# Patient Record
Sex: Male | Born: 2005 | Race: White | Hispanic: No | Marital: Single | Smoking: Never smoker
Health system: Southern US, Community
[De-identification: ages and names within clinical notes are randomized; demographics above are authoritative.]

## PROBLEM LIST (undated history)

## (undated) DIAGNOSIS — T148XXA Other injury of unspecified body region, initial encounter: Secondary | ICD-10-CM

## (undated) DIAGNOSIS — J329 Chronic sinusitis, unspecified: Secondary | ICD-10-CM

## (undated) DIAGNOSIS — R109 Unspecified abdominal pain: Secondary | ICD-10-CM

## (undated) DIAGNOSIS — R059 Cough, unspecified: Secondary | ICD-10-CM

## (undated) DIAGNOSIS — F419 Anxiety disorder, unspecified: Secondary | ICD-10-CM

## (undated) DIAGNOSIS — G8929 Other chronic pain: Secondary | ICD-10-CM

## (undated) DIAGNOSIS — J45909 Unspecified asthma, uncomplicated: Secondary | ICD-10-CM

## (undated) DIAGNOSIS — G4700 Insomnia, unspecified: Secondary | ICD-10-CM

## (undated) DIAGNOSIS — F32A Depression, unspecified: Secondary | ICD-10-CM

## (undated) DIAGNOSIS — M9390 Osteochondropathy, unspecified of unspecified site: Secondary | ICD-10-CM

## (undated) DIAGNOSIS — H9190 Unspecified hearing loss, unspecified ear: Secondary | ICD-10-CM

## (undated) DIAGNOSIS — R519 Headache, unspecified: Secondary | ICD-10-CM

## (undated) DIAGNOSIS — H53001 Unspecified amblyopia, right eye: Secondary | ICD-10-CM

## (undated) DIAGNOSIS — D649 Anemia, unspecified: Secondary | ICD-10-CM

## (undated) HISTORY — DX: Unspecified amblyopia, right eye: H53.001

## (undated) HISTORY — DX: Insomnia, unspecified: G47.00

## (undated) HISTORY — DX: Other chronic pain: G89.29

## (undated) HISTORY — DX: Anemia, unspecified: D64.9

## (undated) HISTORY — PX: KNEE SURGERY: SHX244

## (undated) HISTORY — DX: Headache, unspecified: R51.9

## (undated) HISTORY — DX: Anxiety disorder, unspecified: F41.9

## (undated) HISTORY — DX: Depression, unspecified: F32.A

## (undated) HISTORY — PX: ELBOW SURGERY: SHX618

## (undated) HISTORY — DX: Unspecified asthma, uncomplicated: J45.909

## (undated) HISTORY — DX: Unspecified hearing loss, unspecified ear: H91.90

## (undated) HISTORY — DX: Osteochondropathy, unspecified of unspecified site: M93.90

---
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
## 2023-01-18 ENCOUNTER — Ambulatory Visit: Payer: 59 | Admitting: Gastroenterology

## 2023-01-18 ENCOUNTER — Encounter: Payer: Self-pay | Admitting: Gastroenterology

## 2023-01-18 ENCOUNTER — Other Ambulatory Visit (INDEPENDENT_AMBULATORY_CARE_PROVIDER_SITE_OTHER): Payer: 59

## 2023-01-18 VITALS — BP 104/68 | HR 112 | Ht 70.0 in | Wt 167.0 lb

## 2023-01-18 DIAGNOSIS — R112 Nausea with vomiting, unspecified: Secondary | ICD-10-CM

## 2023-01-18 DIAGNOSIS — R634 Abnormal weight loss: Secondary | ICD-10-CM

## 2023-01-18 DIAGNOSIS — R1011 Right upper quadrant pain: Secondary | ICD-10-CM | POA: Diagnosis not present

## 2023-01-18 LAB — SEDIMENTATION RATE: Sed Rate: 1 mm/hr (ref 0–15)

## 2023-01-18 MED ORDER — ONDANSETRON HCL 8 MG PO TABS: 8.0000 mg | ORAL_TABLET | Freq: Three times a day (TID) | ORAL | 1 refills | Status: AC | PRN

## 2023-01-18 NOTE — Patient Instructions (Signed)
_______________________________________________________  If your blood pressure at your visit was 140/90 or greater, please contact your primary care physician to follow up on this.  _______________________________________________________  If you are age 19 or older, your body mass index should be between 23-30. Your Body mass index is 23.96 kg/m. If this is out of the aforementioned range listed, please consider follow up with your Primary Care Provider.  If you are age 37 or younger, your body mass index should be between 19-25. Your Body mass index is 23.96 kg/m. If this is out of the aformentioned range listed, please consider follow up with your Primary Care Provider.   ________________________________________________________  The [place] [place] [facility] providers would like to encourage you to use [facility] to communicate with providers for non-urgent requests or questions.  Due to long hold times on the telephone, sending your provider a message by [facility] may be a faster and more efficient way to get a response.  Please allow 48 business hours for a response.  Please remember that this is for non-urgent requests.  _______________________________________________________  Bonita Quin have been scheduled for an endoscopy. Please follow written instructions given to you at your visit today. If you use inhalers (even only as needed), please bring them with you on the day of your procedure.   Your provider has requested that you go to the basement level for lab work before leaving today. Press "B" on the elevator. The lab is located at the first door on the left as you exit the elevator.   Due to recent changes in healthcare laws, you may see the results of your imaging and laboratory studies on MyChart before your provider has had a chance to review them.  We understand that in some cases there may be results that are confusing or concerning to you. Not all laboratory results come back in the same time frame and  the provider may be waiting for multiple results in order to interpret others.  Please give [place] 48 hours in order for your provider to thoroughly review all the results before contacting the office for clarification of your results.   It was a pleasure to see you today!  Thank you for trusting me with your gastrointestinal care!

## 2023-01-20 LAB — TISSUE TRANSGLUTAMINASE, IGA: (tTG) Ab, IgA: <1 U/mL

## 2023-01-20 LAB — IGA: Immunoglobulin A: 168 mg/dL (ref 47–310)

## 2023-02-03 ENCOUNTER — Encounter: Payer: Self-pay | Admitting: Certified Registered Nurse Anesthetist

## 2023-02-04 ENCOUNTER — Ambulatory Visit (AMBULATORY_SURGERY_CENTER): Payer: 59 | Admitting: Gastroenterology

## 2023-02-04 ENCOUNTER — Encounter: Payer: Self-pay | Admitting: Gastroenterology

## 2023-02-04 VITALS — BP 97/45 | HR 60 | Temp 98.6°F | Resp 13 | Ht 70.0 in | Wt 167.0 lb

## 2023-02-04 DIAGNOSIS — R634 Abnormal weight loss: Secondary | ICD-10-CM

## 2023-02-04 DIAGNOSIS — R1011 Right upper quadrant pain: Secondary | ICD-10-CM

## 2023-02-04 DIAGNOSIS — R112 Nausea with vomiting, unspecified: Secondary | ICD-10-CM

## 2023-02-04 DIAGNOSIS — R197 Diarrhea, unspecified: Secondary | ICD-10-CM

## 2023-02-04 MED ORDER — SODIUM CHLORIDE 0.9 % IV SOLN: 500.0000 mL | Freq: Once | INTRAVENOUS | Status: DC

## 2023-02-04 NOTE — Progress Notes (Signed)
No changes to clinical history since GI office visit on 01/18/23. Subsequent celiac Ab negative.  The patient is appropriate for an endoscopic procedure in the ambulatory setting.  - Amada Jupiter, MD

## 2023-02-04 NOTE — Op Note (Signed)
[facility] Patient Name: Matthew Booker Procedure Date: 02/04/2023 2:06 PM MRN: [id number] Endoscopist: Sherilyn Cooter L. Myrtie Neither , MD, [id number] Age: 17 Referring MD:  Date of Birth: [birth year] Gender: Male Account #: [id number] Procedure:                Upper GI endoscopy Indications:              Abdominal pain in the right upper quadrant, Nausea                            with vomiting, Weight loss                           clinical details in recent office consult note                           negative tTG Ab Medicines:                Monitored Anesthesia Care Procedure:                Pre-Anesthesia Assessment:                           - Prior to the procedure, a History and Physical                            was performed, and patient medications and                            allergies were reviewed. The patient's tolerance of                            previous anesthesia was also reviewed. The risks                            and benefits of the procedure and the sedation                            options and risks were discussed with the patient.                            All questions were answered, and informed consent                            was obtained. Prior Anticoagulants: The patient has                            taken no anticoagulant or antiplatelet agents. ASA                            Grade Assessment: II - A patient with mild systemic                            disease. After reviewing the risks and benefits,  the patient was deemed in satisfactory condition to                            undergo the procedure.                           After obtaining informed consent, the endoscope was                            passed under direct vision. Throughout the                            procedure, the patient's blood pressure, pulse, and                            oxygen saturations were monitored continuously. The                             Olympus Scope [id number] was introduced through the                            mouth, and advanced to the second part of duodenum.                            The upper GI endoscopy was accomplished without                            difficulty. The patient tolerated the procedure                            well. Scope In: Scope Out: Findings:                 The larynx was normal.                           The esophagus was normal.                           Patchy mildly erythematous mucosa was found in the                            gastric fundus. Several biopsies were obtained with                            cold forceps for histology (antrum and body, lesser                            and greater curve, and same pathology jar to rule                            out H. pylori).                           The cardia and gastric fundus were normal on  retroflexion.                           Normal mucosa was found in the entire duodenum.                            Biopsies for histology were taken with a cold                            forceps for evaluation of celiac disease. Complications:            No immediate complications. Estimated Blood Loss:     Estimated blood loss was minimal. Impression:               - Normal larynx.                           - Normal esophagus.                           - Erythematous mucosa in the gastric fundus.                            (Nonspecific finding. Unclear if related to the                            cause of symptoms or effect of vomiting)                           - Normal mucosa was found in the entire examined                            duodenum. Biopsied.                           - Several biopsies were obtained. Recommendation:           - Patient has a contact number available for                            emergencies. The signs and symptoms of potential                            delayed  complications were discussed with the                            patient. Return to normal activities tomorrow.                            Written discharge instructions were provided to the                            patient.                           - Resume previous diet.                           -  Continue present medications.                           - Await pathology results.                           - If all biopsies normal, schedule CT scan abdomen                            and pelvis with oral and IV contrast for the                            symptoms noted above. Clint Biello L. Myrtie Neither, MD 02/04/2023 2:24:50 PM This report has been signed electronically.

## 2023-02-04 NOTE — Patient Instructions (Signed)
Please read handouts provided. Continue present medications. Await pathology results.   YOU HAD AN ENDOSCOPIC PROCEDURE TODAY AT THE [facility]:   Refer to the procedure report that was given to you for any specific questions about what was found during the examination.  If the procedure report does not answer your questions, please call your gastroenterologist to clarify.  If you requested that your care partner not be given the details of your procedure findings, then the procedure report has been included in a sealed envelope for you to review at your convenience later.  YOU SHOULD EXPECT: Some feelings of bloating in the abdomen. Passage of more gas than usual.  Walking can help get rid of the air that was put into your GI tract during the procedure and reduce the bloating. If you had a lower endoscopy (such as a colonoscopy or flexible sigmoidoscopy) you may notice spotting of blood in your stool or on the toilet paper. If you underwent a bowel prep for your procedure, you may not have a normal bowel movement for a few days.  Please Note:  You might notice some irritation and congestion in your nose or some drainage.  This is from the oxygen used during your procedure.  There is no need for concern and it should clear up in a day or so.  SYMPTOMS TO REPORT IMMEDIATELY:  Following upper endoscopy (EGD)  Vomiting of blood or coffee ground material  New chest pain or pain under the shoulder blades  Painful or persistently difficult swallowing  New shortness of breath  Fever of 100F or higher  Black, tarry-looking stools  For urgent or emergent issues, a gastroenterologist can be reached at any hour by calling [phone]. Do not use MyChart messaging for urgent concerns.    DIET:  We do recommend a small meal at first, but then you may proceed to your regular diet.  Drink plenty of fluids but you should avoid alcoholic beverages for 24 hours.  ACTIVITY:  You should plan  to take it easy for the rest of today and you should NOT DRIVE or use heavy machinery until tomorrow (because of the sedation medicines used during the test).    FOLLOW UP: Our staff will call the number listed on your records the next business day following your procedure.  We will call around 7:15- 8:00 am to check on you and address any questions or concerns that you may have regarding the information given to you following your procedure. If we do not reach you, we will leave a message.     If any biopsies were taken you will be contacted by phone or by letter within the next 1-3 weeks.  Please call us at [phone] if you have not heard about the biopsies in 3 weeks.    SIGNATURES/CONFIDENTIALITY: You and/or your care partner have signed paperwork which will be entered into your electronic medical record.  These signatures attest to the fact that that the information above on your After Visit Summary has been reviewed and is understood.  Full responsibility of the confidentiality of this discharge information lies with you and/or your care-partner.  

## 2023-02-04 NOTE — Progress Notes (Signed)
Called to room to assist during endoscopic procedure.  Patient ID and intended procedure confirmed with present staff. Received instructions for my participation in the procedure from the performing physician.  

## 2023-02-04 NOTE — Progress Notes (Signed)
1405 Robinul 0.1 mg IV given due large amount of secretions upon assessment.  MD made aware, vss  

## 2023-02-05 ENCOUNTER — Telehealth: Payer: Self-pay

## 2023-02-05 NOTE — Telephone Encounter (Signed)
Attempted follow up call post EGD; no answer; left VM

## 2023-02-07 ENCOUNTER — Other Ambulatory Visit: Payer: Self-pay

## 2023-02-07 DIAGNOSIS — R634 Abnormal weight loss: Secondary | ICD-10-CM

## 2023-02-07 DIAGNOSIS — R197 Diarrhea, unspecified: Secondary | ICD-10-CM

## 2023-02-07 DIAGNOSIS — R112 Nausea with vomiting, unspecified: Secondary | ICD-10-CM

## 2023-02-13 ENCOUNTER — Telehealth: Payer: Self-pay

## 2023-02-13 NOTE — Telephone Encounter (Signed)
-----   Message from [facility] sent at 02/13/2023 10:18 AM EDT ----- Regarding: RE: ASAP CT Appt Made two more attempts - reached the pt , said he would call back and hadn't heard back and tried calling mom , goes to vmail and no vmail set up . ----- Message ----- From: Missy Sabins, RN Sent: 02/08/2023  12:46 PM EDT To: April H Pait; Claudean Kinds Subject: RE: ASAP CT Appt                               Thanks, will you please try again next week or later today. Thanks ----- Message ----- From: Sinda Du H Sent: 02/08/2023  11:03 AM EDT To: Missy Sabins, RN Subject: Annell Greening: ASAP CT Appt                                ----- Message ----- From: Claudean Kinds Sent: 02/08/2023  11:02 AM EDT To: April H Pait Subject: RE: ASAP CT Appt                               Called mobile -no answer - vmail full and called moms number - no vmail set up.  ----- Message ----- From: Alphonzo Lemmings Sent: 02/07/2023   4:59 PM EDT To: Claudean Kinds Subject: FW: ASAP CT Appt                                ----- Message ----- From: Missy Sabins, RN Sent: 02/07/2023   4:42 PM EDT To: April H Pait; Marylynn Pearson Subject: ASAP CT Appt                                   Please contact patient's mother Herbert Seta) ASAP to schedule CT ABD/Pelvis. The order is in epic. Thanks

## 2023-02-14 NOTE — Telephone Encounter (Signed)
Patient mother called stating her phone number changed and was not sure if she was reached out to yet. She is requesting a call back on her updated phone number to schedule patients CT. Please advise, thank you.

## 2023-02-14 NOTE — Telephone Encounter (Signed)
Returned call and got the same message that voice mailbox had not been set up yet. Please give pts mother the phone number [phone] to call and schedule the CT scan.

## 2023-02-14 NOTE — Telephone Encounter (Signed)
Inbound call from patient's mother returning call. [phone]

## 2023-02-14 NOTE — Telephone Encounter (Signed)
Attempted to call mothers number and got message that voice mail has not been set up yet. Unable to reach her or leave a message.

## 2023-02-15 NOTE — Telephone Encounter (Signed)
Attempted to reach patient's mother. No vm is set up. Unable to leave a message.

## 2023-02-19 NOTE — Telephone Encounter (Signed)
Called and left patient's mother Herbert Seta) a detailed vm letting her know that radiology scheduling has reached out several times and has not been able to reach them. I provided Herbert Seta with the phone number to radiology scheduling to set up CT appt at their convenience.

## 2023-07-24 DIAGNOSIS — S91332A Puncture wound without foreign body, left foot, initial encounter: Secondary | ICD-10-CM | POA: Diagnosis not present

## 2023-07-24 DIAGNOSIS — R110 Nausea: Secondary | ICD-10-CM | POA: Diagnosis not present

## 2023-10-23 DIAGNOSIS — K921 Melena: Secondary | ICD-10-CM | POA: Diagnosis not present

## 2024-04-17 ENCOUNTER — Other Ambulatory Visit (INDEPENDENT_AMBULATORY_CARE_PROVIDER_SITE_OTHER)

## 2024-04-17 ENCOUNTER — Ambulatory Visit: Payer: Self-pay | Admitting: Gastroenterology

## 2024-04-17 ENCOUNTER — Encounter: Payer: Self-pay | Admitting: Gastroenterology

## 2024-04-17 ENCOUNTER — Ambulatory Visit (INDEPENDENT_AMBULATORY_CARE_PROVIDER_SITE_OTHER): Admitting: Gastroenterology

## 2024-04-17 VITALS — BP 118/80 | HR 70 | Ht 70.15 in | Wt 173.0 lb

## 2024-04-17 DIAGNOSIS — R6881 Early satiety: Secondary | ICD-10-CM

## 2024-04-17 DIAGNOSIS — K5900 Constipation, unspecified: Secondary | ICD-10-CM

## 2024-04-17 DIAGNOSIS — R112 Nausea with vomiting, unspecified: Secondary | ICD-10-CM

## 2024-04-17 DIAGNOSIS — R1010 Upper abdominal pain, unspecified: Secondary | ICD-10-CM

## 2024-04-17 DIAGNOSIS — R194 Change in bowel habit: Secondary | ICD-10-CM

## 2024-04-17 LAB — COMPREHENSIVE METABOLIC PANEL WITH GFR
ALT: 89 U/L — ABNORMAL HIGH (ref 0–53)
AST: 35 U/L (ref 0–37)
Albumin: 4.6 g/dL (ref 3.5–5.2)
Alkaline Phosphatase: 56 U/L (ref 52–171)
BUN: 8 mg/dL (ref 6–23)
CO2: 30 meq/L (ref 19–32)
Calcium: 9.1 mg/dL (ref 8.4–10.5)
Chloride: 104 meq/L (ref 96–112)
Creatinine, Ser: 0.89 mg/dL (ref 0.40–1.50)
GFR: 124.1 mL/min (ref >60.00)
Glucose, Bld: 94 mg/dL (ref 70–99)
Potassium: 3.8 meq/L (ref 3.5–5.1)
Sodium: 139 meq/L (ref 135–145)
Total Bilirubin: 0.8 mg/dL (ref 0.2–1.2)
Total Protein: 7.2 g/dL (ref 6.0–8.3)

## 2024-04-17 LAB — CBC WITH DIFFERENTIAL/PLATELET
Basophils Absolute: 0 K/uL (ref 0.0–0.1)
Basophils Relative: 0.7 % (ref 0.0–3.0)
Eosinophils Absolute: 0.2 K/uL (ref 0.0–0.7)
Eosinophils Relative: 3.1 % (ref 0.0–5.0)
HCT: 39.5 % (ref 36.0–49.0)
Hemoglobin: 13.4 g/dL (ref 12.0–16.0)
Lymphocytes Relative: 36.8 % (ref 24.0–48.0)
Lymphs Abs: 2.3 K/uL (ref 0.7–4.0)
MCHC: 33.9 g/dL (ref 31.0–37.0)
MCV: 88 fl (ref 78.0–98.0)
Monocytes Absolute: 0.6 K/uL (ref 0.1–1.0)
Monocytes Relative: 9.8 % (ref 3.0–12.0)
Neutro Abs: 3.1 K/uL (ref 1.4–7.7)
Neutrophils Relative %: 49.6 % (ref 43.0–71.0)
Platelets: 234 K/uL (ref 150.0–575.0)
RBC: 4.49 Mil/uL (ref 3.80–5.70)
RDW: 12.4 % (ref 11.4–15.5)
WBC: 6.3 K/uL (ref 4.5–13.5)

## 2024-04-17 LAB — TSH: TSH: 3.36 u[IU]/mL (ref 0.40–5.00)

## 2024-04-17 MED ORDER — PANTOPRAZOLE SODIUM 40 MG PO TBEC: 40.0000 mg | DELAYED_RELEASE_TABLET | Freq: Every day | ORAL | 2 refills | Status: AC

## 2024-04-17 NOTE — Patient Instructions (Signed)
 Your provider has requested that you go to the basement level for lab work before leaving today. Press B on the elevator. The lab is located at the first door on the left as you exit the elevator.  We have sent the following medications to your pharmacy for you to pick up at your convenience: Pantoprazole   Start taking Miralax 1 capful daily if diarrhea occurs do 1/2 cap  Start taking Benefiber 1 tablespoon daily  You have been scheduled for a CT scan of the abdomen and pelvis at [facility], 1st floor Radiology. You are scheduled on 04/24/24 at 4:30 pm. You should arrive at 2:15 pm to your appointment time for registration.   You may take any medications as prescribed with a small amount of water, if necessary. If you take any of the following medications: METFORMIN, GLUCOPHAGE, GLUCOVANCE, AVANDAMET, RIOMET, FORTAMET, ACTOPLUS MET, JANUMET, GLUMETZA or METAGLIP, you MAY be asked to HOLD this medication 48 hours AFTER the exam.   The purpose of you drinking the oral contrast is to aid in the visualization of your intestinal tract. The contrast solution may cause some diarrhea. Depending on your individual set of symptoms, you may also receive an intravenous injection of x-ray contrast/dye. Plan on being at [facility] for 45 minutes or longer, depending on the type of exam you are having performed.   If you have any questions regarding your exam or if you need to reschedule, you may call [facility] at [phone] between the hours of 8:00 am and 5:00 pm, Monday-Friday.     If your blood pressure at your visit was 140/90 or greater, please contact your primary care physician to follow up on this.  _______________________________________________________  If you are age 20 or older, your body mass index should be between 23-30. Your Body mass index is 24.72 kg/m. If this is out of the aforementioned range listed, please consider follow up with your Primary Care  Provider.  If you are age 20 or younger, your body mass index should be between 19-25. Your Body mass index is 24.72 kg/m. If this is out of the aformentioned range listed, please consider follow up with your Primary Care Provider.   ________________________________________________________  The [place] [facility] providers would like to encourage you to use MYCHART to communicate with providers for non-urgent requests or questions.  Due to long hold times on the telephone, sending your provider a message by [facility] may be a faster and more efficient way to get a response.  Please allow 48 business hours for a response.  Please remember that this is for non-urgent requests.  _______________________________________________________  Thank you for trusting me with your gastrointestinal care!   Camie Furbish, PA-C

## 2024-04-17 NOTE — Progress Notes (Signed)
 Matthew Booker [id number] 2006-01-20   Chief Complaint: Nausea, vomiting, abdominal pain  Referring Provider: Jolanda Macintosh, MD Primary GI MD: Dr. Legrand  HPI: Matthew Booker is a 18 y.o. male with past medical history of anemia who presents today for a complaint of nausea and abdominal pain.   Patient seen by Dr. Legrand 01/18/2023 for complaint of nausea, vomiting, and weight loss.  Was also having some right-sided abdominal pain, though not typical for biliary colic.   Labs 01/18/2023: Negative celiac serology, normal CRP  He underwent EGD 02/04/2023 which revealed erythematous mucosa in the gastric fundus with normal biopsies.  Plan was to schedule CT A/P for weight loss.  There were multiple unsuccessful attempts to contact patient's mother to set this up, has not been done.  At PCP visit 11/2022, had been started on omeprazole but did not find this helpful.  Primary care labs 11/30/2022: Normal CBC , ALT 52, AST 30, remainder of CMP including albumin and calcium normal, TSH normal at 1.1, free T4 normal at 0.94, lipase 13  Today patient states he continues to have intermittent nausea and vomiting, though symptoms are less frequent than they were a year ago.  States he used to have nausea daily, now is having nausea a couple times a week, often with associated vomiting.  Vomiting can occur after eating or can come on at random.  He does notice that spicy foods make this worse.  States he took Prilosec for a couple of months as prescribed by his PCP but stopped taking because he did not feel that it was helping.  He does have occasional acid reflux with spicy foods.  Denies any heartburn.  Denies hematemesis.  States that he took a family member's Protonix  recently and it did help.  He reports upper abdominal pain which is associated with nausea and vomiting.  Also continues to have a decreased appetite and early satiety.  States he does not eat much.  He denies continued weight loss, actually  has gained a few pounds.  He reports alternating bowel habits.  States he can go a couple days without a bowel movement, but then some days will have frequent, small bowel movements, up to 8-9 in a day.  States that his stools are formed when this occurs and not loose.  He denies any blood in his stool or melena.  He is not taking any fiber supplements or laxatives.  Sometimes has lower abdominal pain which is relieved by bowel movement.  Patient reports that he vapes often.  Vape contains nicotine .  He denies marijuana use.  Rare alcohol use.  He currently works at [employer]	[place] in [employer].  He is studying business at [employer].  Previous GI Procedures/Imaging   EGD 02/04/2023 - Normal larynx.  - Normal esophagus.  - Erythematous mucosa in the gastric fundus. ( Nonspecific finding. Unclear if related to the cause of symptoms or effect of vomiting)  - Normal mucosa was found in the entire examined duodenum. Biopsied.  - Several biopsies were obtained. Path: 1. Surgical [P], duodenal bulb, 2nd portion of duodenum and distal duodenum - DUODENAL MUCOSA WITH NO SIGNIFICANT PATHOLOGY. 2. Surgical [P], gastric antrum and gastric body - ANTRAL AND OXYNTIC MUCOSA WITH NO SIGNIFICANT PATHOLOGY. - NO HELICOBACTER PYLORI ORGANISMS ARE IDENTIFIED ON THE H&E STAINED SLIDE.  Past Medical History:  Diagnosis Date   Amblyopia ex anopsia of right eye    Anemia    Anxiety    Asthma    Chronic  headaches    Depression    Hearing loss    Insomnia    Osteochondritis     Past Surgical History:  Procedure Laterality Date   ELBOW SURGERY Right    KNEE SURGERY     x2    Current Outpatient Medications  Medication Sig Dispense Refill   albuterol (VENTOLIN HFA) 108 (90 Base) MCG/ACT inhaler Inhale 2 puffs into the lungs every 6 (six) hours as needed.     doxycycline (VIBRA-TABS) 100 MG tablet Take 100 mg by mouth daily.     escitalopram  (LEXAPRO ) 10 MG tablet Take 1 tablet (10 mg total)  by mouth daily. (Patient not taking: Reported on 01/18/2023) 30 tablet 0   hydrOXYzine  (ATARAX /VISTARIL ) 25 MG tablet Take 1 tablet (25 mg total) by mouth 3 (three) times daily as needed for anxiety. (Patient not taking: Reported on 01/18/2023) 30 tablet 0   minocycline  (MINOCIN ) 100 MG capsule Take 100 mg by mouth daily. With Food (Patient not taking: Reported on 01/18/2023)     omeprazole (PRILOSEC) 20 MG capsule Take 20 mg by mouth daily.     ondansetron  (ZOFRAN ) 8 MG tablet Take 1 tablet (8 mg total) by mouth every 8 (eight) hours as needed for nausea or vomiting. 45 tablet 1   ondansetron  (ZOFRAN -ODT) 8 MG disintegrating tablet Take 8 mg by mouth 3 (three) times daily as needed.     promethazine (PHENERGAN) 25 MG tablet Take by mouth.     tretinoin (RETIN-A) 0.05 % cream Apply 1 Application topically at bedtime.     No current facility-administered medications for this visit.    Allergies as of 04/17/2024 - Review Complete 02/04/2023  Allergen Reaction Noted   Benadryl [diphenhydramine] Nausea Only 04/18/2021    Family History  Problem Relation Age of Onset   Cancer Paternal Uncle    Breast cancer Paternal Grandmother    Colon cancer Neg Hx    Esophageal cancer Neg Hx    Stomach cancer Neg Hx    Rectal cancer Neg Hx     Social History   Tobacco Use   Smoking status: Never   Smokeless tobacco: Never  Vaping Use   Vaping status: Former  Substance Use Topics   Alcohol use: Not Currently    Comment: Says never a problem/Quit 5 months ago   Drug use: Yes    Types: Marijuana    Comment: 1 x month maybe     Review of Systems:    Constitutional: Weight stable, no fever, chills, weakness or fatigue Skin: No rash or itching Cardiovascular: No chest pain, chest pressure or palpitations   Respiratory: No SOB or cough Gastrointestinal: See HPI and otherwise negative Genitourinary: No dysuria or change in urinary frequency Neurological: No headache, dizziness or  syncope Musculoskeletal: No new muscle or joint pain Hematologic: No bleeding or bruising    Physical Exam:  Vital signs: BP 118/80   Pulse 70   Ht 5' 10.15 (1.782 m)   Wt 173 lb (78.5 kg)   BMI 24.72 kg/m    Wt Readings from Last 3 Encounters:  04/17/24 173 lb (78.5 kg) (75%, Z= 0.66)*  02/04/23 167 lb (75.8 kg) (73%, Z= 0.62)*  01/18/23 167 lb (75.8 kg) (73%, Z= 0.63)*   * Growth percentiles are based on CDC (Boys, 2-20 Years) data.    Constitutional: NAD, Well developed, Well nourished, alert and cooperative Head:  Normocephalic and atraumatic.  Eyes: No scleral icterus. Conjunctiva pink. Mouth: No oral lesions. Poor dentition. Respiratory:  Respirations even and unlabored. Lungs clear to auscultation bilaterally.  No wheezes, crackles, or rhonchi.  Cardiovascular:  Regular rate and rhythm. No murmurs. No peripheral edema. Gastrointestinal:  Soft, nondistended, mildly tender to palpation of epigastrium and LUQ. No rebound or guarding. Normal bowel sounds. No appreciable masses or hepatomegaly. Rectal:  Not performed.  Neurologic:  Alert and oriented x4;  grossly normal neurologically.  Skin:   Dry and intact without significant lesions or rashes. Psychiatric: Oriented to person, place and time. Demonstrates good judgement and reason without abnormal affect or behaviors.   RELEVANT LABS AND IMAGING: CBC    Component Value Date/Time   WBC 7.6 04/17/2021 1717   RBC 5.31 04/17/2021 1717   HGB 16.0 04/17/2021 1717   HCT 46.4 04/17/2021 1717   PLT 289 04/17/2021 1717   MCV 87.4 04/17/2021 1717   MCH 30.1 04/17/2021 1717   MCHC 34.5 04/17/2021 1717   RDW 11.4 04/17/2021 1717   LYMPHSABS 2.2 04/17/2021 1717   MONOABS 0.5 04/17/2021 1717   EOSABS 0.2 04/17/2021 1717   BASOSABS 0.0 04/17/2021 1717    CMP     Component Value Date/Time   NA 136 04/17/2021 1717   K 3.8 04/17/2021 1717   CL 102 04/17/2021 1717   CO2 27 04/17/2021 1717   GLUCOSE 98 04/17/2021 1717    BUN 7 04/17/2021 1717   CREATININE 0.92 04/17/2021 1717   CALCIUM 9.4 04/17/2021 1717   PROT 7.8 04/17/2021 1717   ALBUMIN 5.0 04/17/2021 1717   AST 23 04/17/2021 1717   ALT 21 04/17/2021 1717   ALKPHOS 86 04/17/2021 1717   BILITOT 2.0 (H) 04/17/2021 1717   GFRNONAA NOT CALCULATED 04/17/2021 1717     Assessment/Plan:   Nausea with vomiting Upper abdominal pain Early satiety Alternating bowel habits/constipation Patient continues to have intermittent nausea and vomiting with associated upper abdominal pain and early satiety.  Denies further weight loss, has gained a few pounds since he was last seen here.  Denies improvement on omeprazole in the past, though has taken a family member's prescription Protonix  a couple times and seems to help.  He can go a few days without a bowel movement, or have frequent bowel movements, though he denies any loose stool or diarrhea.  When he has frequent bowel movements he typically passes a small amount of formed stool each time.  Suspect constipation.  He denies any blood in his stool or melena.  EGD 01/2023 unremarkable with normal biopsies.  He has had negative celiac serology, normal CRP, normal TSH.  Plan was to get a CT scan of his abdomen but this was not completed.  No recent abdominal imaging.  - Will order CT A/P for nausea, vomiting, abdominal pain, early satiety, constipation - Start Protonix  40 mg for 6 weeks - Check labs today: CBC, CMP - Start Benefiber 1 TBSP daily - Recommend Miralax for constipation, 1 capful daily, can decrease to 1/2 cap if needed - Consider gastric emptying study if CT unrevealing - Consider colonoscopy if no improvement in bowel habits with fiber/miralax or if development of rectal bleeding   Matthew Furbish, PA-C [facility] 04/17/2024, 8:32 AM  Patient Care Team: Jolanda Macintosh, MD as PCP - General (Pediatrics)

## 2024-04-19 NOTE — Progress Notes (Signed)
 ____________________________________________________________  Attending physician addendum:  Thank you for sending this case to me. I have reviewed the entire note and agree with the plan.  Suspect these are functional upper digestive symptoms and not likely gastroparesis. He will do gastric emptying study and colonoscopy in this patient likely low.  Victory Brand, MD  ____________________________________________________________

## 2024-04-24 ENCOUNTER — Ambulatory Visit (HOSPITAL_COMMUNITY)

## 2024-04-27 NOTE — Telephone Encounter (Signed)
 Called patient to discuss lab results no answer left message on voice mail to call office also sent patient a mychart message as well.

## 2024-05-08 NOTE — Progress Notes (Signed)
 Called patient no answer left message on voice mail to call office also mailed patient results to address on file.

## 2024-05-11 DIAGNOSIS — L0231 Cutaneous abscess of buttock: Secondary | ICD-10-CM | POA: Diagnosis not present

## 2024-05-29 ENCOUNTER — Ambulatory Visit: Admitting: Gastroenterology

## 2024-05-29 NOTE — Progress Notes (Deleted)
 Matthew Booker [id number] 04-03-2006   Chief Complaint:  Referring Provider: Jolanda Macintosh, MD Primary GI MD: Dr. Legrand  HPI: Matthew Booker is a 18 y.o. male with past medical history of anemia who presents today for follow up.   Patient seen by Dr. Legrand 01/18/2023 for complaint of nausea, vomiting, and weight loss.  Was also having some right-sided abdominal pain, though not typical for biliary colic.    Labs 01/18/2023: Negative celiac serology, normal CRP   He underwent EGD 02/04/2023 which revealed erythematous mucosa in the gastric fundus with normal biopsies.  Plan was to schedule CT A/P for weight loss.  There were multiple unsuccessful attempts to contact patient's mother to set this up, has not been done.   At PCP visit 11/2022, had been started on omeprazole but did not find this helpful.   Primary care labs 11/30/2022: Normal CBC , ALT 52, AST 30, remainder of CMP including albumin and calcium normal, TSH normal at 1.1, free T4 normal at 0.94, lipase 13  Last seen in office 04/17/2024 at which time he reported intermittent nausea and vomiting with associated upper abdominal pain and early satiety.  Had gained a few pounds since last visit.  Denied improvement on omeprazole in the past, though had taken a family members prescription of Protonix  a couple times which seemed to help.  Endorsed that he can go few days without a bowel movement, or have frequent bowel movements but denies any loose stool or diarrhea.  Constipation was suspected as when he did have frequent bowel movements he was passing only small amount of formed stool.  Denied any blood in his stool or melena.  CT was ordered for further evaluation of symptoms.  He was started on Protonix .  Also advised to take Benefiber 1 tablespoon daily and start MiraLAX as needed.  Per note from Dr. Legrand, symptoms likely functional in nature, gastroparesis unlikely.  GES and colonoscopy in this patient likely low yield.  CT scan  has not been completed.  On labs had mild elevation in ALT, otherwise normal CBC, CMP, TSH.   Previous GI Procedures/Imaging   EGD 02/04/2023 - Normal larynx.  - Normal esophagus.  - Erythematous mucosa in the gastric fundus. ( Nonspecific finding. Unclear if related to the cause of symptoms or effect of vomiting)  - Normal mucosa was found in the entire examined duodenum. Biopsied.  - Several biopsies were obtained. Path: 1. Surgical [P], duodenal bulb, 2nd portion of duodenum and distal duodenum - DUODENAL MUCOSA WITH NO SIGNIFICANT PATHOLOGY. 2. Surgical [P], gastric antrum and gastric body - ANTRAL AND OXYNTIC MUCOSA WITH NO SIGNIFICANT PATHOLOGY. - NO HELICOBACTER PYLORI ORGANISMS ARE IDENTIFIED ON THE H&E STAINED SLIDE.   Past Medical History:  Diagnosis Date   Amblyopia ex anopsia of right eye    Anemia    Anxiety    Asthma    Chronic headaches    Depression    Hearing loss    Insomnia    Osteochondritis     Past Surgical History:  Procedure Laterality Date   ELBOW SURGERY Right    KNEE SURGERY     x2    Current Outpatient Medications  Medication Sig Dispense Refill   albuterol (VENTOLIN HFA) 108 (90 Base) MCG/ACT inhaler Inhale 2 puffs into the lungs every 6 (six) hours as needed.     doxycycline (VIBRA-TABS) 100 MG tablet Take 100 mg by mouth daily. (Patient not taking: Reported on 04/17/2024)     escitalopram  (LEXAPRO )  10 MG tablet Take 1 tablet (10 mg total) by mouth daily. (Patient not taking: Reported on 04/17/2024) 30 tablet 0   hydrOXYzine  (ATARAX /VISTARIL ) 25 MG tablet Take 1 tablet (25 mg total) by mouth 3 (three) times daily as needed for anxiety. 30 tablet 0   minocycline  (MINOCIN ) 100 MG capsule Take 100 mg by mouth daily. With Food (Patient not taking: Reported on 04/17/2024)     ondansetron  (ZOFRAN ) 8 MG tablet Take 1 tablet (8 mg total) by mouth every 8 (eight) hours as needed for nausea or vomiting. 45 tablet 1   ondansetron  (ZOFRAN -ODT) 8 MG  disintegrating tablet Take 8 mg by mouth 3 (three) times daily as needed. (Patient not taking: Reported on 04/17/2024)     pantoprazole  (PROTONIX ) 40 MG tablet Take 1 tablet (40 mg total) by mouth daily. 30 tablet 2   promethazine (PHENERGAN) 25 MG tablet Take by mouth.     tretinoin (RETIN-A) 0.05 % cream Apply 1 Application topically at bedtime.     No current facility-administered medications for this visit.    Allergies as of 05/29/2024 - Review Complete 04/17/2024  Allergen Reaction Noted   Benadryl [diphenhydramine] Nausea Only 04/18/2021    Family History  Problem Relation Age of Onset   Cancer Paternal Uncle    Breast cancer Paternal Grandmother    Colon cancer Neg Hx    Esophageal cancer Neg Hx    Stomach cancer Neg Hx    Rectal cancer Neg Hx     Social History   Tobacco Use   Smoking status: Never   Smokeless tobacco: Never  Vaping Use   Vaping status: Former  Substance Use Topics   Alcohol use: Not Currently    Comment: Says never a problem/Quit 5 months ago   Drug use: Yes    Types: Marijuana    Comment: 1 x month maybe     Review of Systems:    Constitutional: No weight loss, fever, chills, weakness or fatigue Eyes: No change in vision Ears, Nose, Throat:  No change in hearing or congestion Skin: No rash or itching Cardiovascular: No chest pain, chest pressure or palpitations   Respiratory: No SOB or cough Gastrointestinal: See HPI and otherwise negative Genitourinary: No dysuria or change in urinary frequency Neurological: No headache, dizziness or syncope Musculoskeletal: No new muscle or joint pain Hematologic: No bleeding or bruising    Physical Exam:  Vital signs: There were no vitals taken for this visit.  Constitutional: NAD, Well developed, Well nourished, alert and cooperative Head:  Normocephalic and atraumatic.  Eyes: No scleral icterus. Conjunctiva pink. Mouth: No oral lesions. Respiratory: Respirations even and unlabored. Lungs  clear to auscultation bilaterally.  No wheezes, crackles, or rhonchi.  Cardiovascular:  Regular rate and rhythm. No murmurs. No peripheral edema. Gastrointestinal:  Soft, nondistended, nontender. No rebound or guarding. Normal bowel sounds. No appreciable masses or hepatomegaly. Rectal:  Not performed.  Neurologic:  Alert and oriented x4;  grossly normal neurologically.  Skin:   Dry and intact without significant lesions or rashes. Psychiatric: Oriented to person, place and time. Demonstrates good judgement and reason without abnormal affect or behaviors.   RELEVANT LABS AND IMAGING: CBC    Component Value Date/Time   WBC 6.3 04/17/2024 1210   RBC 4.49 04/17/2024 1210   HGB 13.4 04/17/2024 1210   HCT 39.5 04/17/2024 1210   PLT 234.0 04/17/2024 1210   MCV 88.0 04/17/2024 1210   MCH 30.1 04/17/2021 1717   MCHC 33.9 04/17/2024 1210  RDW 12.4 04/17/2024 1210   LYMPHSABS 2.3 04/17/2024 1210   MONOABS 0.6 04/17/2024 1210   EOSABS 0.2 04/17/2024 1210   BASOSABS 0.0 04/17/2024 1210    CMP     Component Value Date/Time   NA 139 04/17/2024 1210   K 3.8 04/17/2024 1210   CL 104 04/17/2024 1210   CO2 30 04/17/2024 1210   GLUCOSE 94 04/17/2024 1210   BUN 8 04/17/2024 1210   CREATININE 0.89 04/17/2024 1210   CALCIUM 9.1 04/17/2024 1210   PROT 7.2 04/17/2024 1210   ALBUMIN 4.6 04/17/2024 1210   AST 35 04/17/2024 1210   ALT 89 (H) 04/17/2024 1210   ALKPHOS 56 04/17/2024 1210   BILITOT 0.8 04/17/2024 1210   GFRNONAA NOT CALCULATED 04/17/2021 1717     Assessment/Plan:    Repeat labs today to monitor liver function   Camie Furbish, PA-C [facility] 05/29/2024, 12:08 PM  Patient Care Team: Jolanda Macintosh, MD as PCP - General (Pediatrics)

## 2024-08-04 DIAGNOSIS — R030 Elevated blood-pressure reading, without diagnosis of hypertension: Secondary | ICD-10-CM | POA: Diagnosis not present

## 2024-08-04 DIAGNOSIS — K029 Dental caries, unspecified: Secondary | ICD-10-CM | POA: Diagnosis not present

## 2024-08-04 DIAGNOSIS — K0381 Cracked tooth: Secondary | ICD-10-CM | POA: Diagnosis not present

## 2024-08-04 DIAGNOSIS — K046 Periapical abscess with sinus: Secondary | ICD-10-CM | POA: Diagnosis not present
